# Patient Record
Sex: Male | Born: 1995 | Race: White | Hispanic: No | Marital: Married
Health system: Southern US, Community
[De-identification: ages and names within clinical notes are randomized; demographics above are authoritative.]

---
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
## 2013-06-04 ENCOUNTER — Emergency Department: Payer: Self-pay | Admitting: Emergency Medicine

## 2014-10-06 ENCOUNTER — Emergency Department: Payer: Self-pay | Admitting: Emergency Medicine

## 2015-03-28 NOTE — Consult Note (Signed)
PATIENT NAME:  Roger Walls, [name] MR#:  [id number] DATE OF BIRTH:  [birth year]  DATE OF CONSULTATION:  02/15/2012  REFERRING PHYSICIAN:  Daryel NovemberJonathan Williams, MD  CONSULTING PHYSICIAN:  Zackery BarefootJ. Madison Rochele Lueck, MD  HISTORY OF PRESENT ILLNESS: The patient is a 19 year old young man who was playing Lacrosse with no helmet, got hit in the ear with a Lacrosse ball. There is some question as to whether or not he has an auricular hematoma. He had no loss of consciousness. Had a CT scan of the head which was negative. Had a CT scan of the maxillofacial area which was negative.   ALLERGIES: None.   MEDICATIONS: None.   PAST MEDICAL HISTORY: Negative.   SOCIAL HISTORY: The patient plays high school Lacrosse.   PHYSICAL EXAMINATION: Right ear is bruised and ecchymotic. There is no palpable subperichondrial fluid collection. No drainable hematoma. There is also palpable fracture of the concha cymba, concha cavum, tender to palpation.   IMPRESSION: Right ear ecchymosis secondary to trauma (Lacrosse ball).  RECOMMENDATIONS: Recommended ice pack to the ear for 48 hours while awake. Careful avoidance of traumatic manipulation of the ear to include wearing Lacrosse helmet. I will follow-up with him on Monday in my clinic. If there has been no interval increase in fluid collection in the subperichondrial pinna, then there will be no need for operative intervention and he can resume Lacrosse as long as he wears his helmet.  I discussed all of this with his parents and with the patient himself. If he develops more of a fluid collection, he will call and we will drain any fluid collection and apply the appropriate compressive dressing.     I did advise him that he will likely develop long-term mild increase in ossification of his cartilage in the traumatized area but there should be no visible morphologic changes of the ear.   ____________________________ J. Gertie BaronMadison Darivs Lunden, MD jmc:drc D: 02/16/2012 05:33:49  ET T: 02/16/2012 09:53:47 ET JOB#: [id number]  cc: Zackery BarefootJ. Madison Zaiah Credeur, MD, <Dictator> Glorious [facility]achSonya [facility] Wendee CoppJMADISON Dalayna Lauter MD ELECTRONICALLY SIGNED 03/01/2012 11:33

## 2020-07-26 ENCOUNTER — Ambulatory Visit: Payer: Self-pay | Attending: Internal Medicine

## 2020-07-26 DIAGNOSIS — Z23 Encounter for immunization: Secondary | ICD-10-CM

## 2020-07-26 NOTE — Progress Notes (Signed)
   Covid-19 Vaccination Clinic  Name:  Roger Walls    MRN: [id number] DOB: 1996/05/17  07/26/2020  [name] was observed post Covid-19 immunization for 15 minutes without incident. He was provided with Vaccine Information Sheet and instruction to access the V-Safe system.   [name] was instructed to call 911 with any severe reactions post vaccine: Marland Kitchen Difficulty breathing  . Swelling of face and throat  . A fast heartbeat  . A bad rash all over body  . Dizziness and weakness   Immunizations Administered    Name Date Dose VIS Date Route   Pfizer COVID-19 Vaccine 07/26/2020  2:11 PM 0.3 mL 01/28/2019 Intramuscular   Manufacturer: [employer], [employer]   Lot: [id number]   NDC: [address]-2

## 2020-08-16 ENCOUNTER — Ambulatory Visit: Payer: Self-pay

## 2020-08-23 ENCOUNTER — Ambulatory Visit: Payer: Self-pay | Attending: Internal Medicine

## 2020-08-23 DIAGNOSIS — Z23 Encounter for immunization: Secondary | ICD-10-CM

## 2020-08-23 NOTE — Progress Notes (Signed)
° °  Covid-19 Vaccination Clinic  Name:  Roger Walls    MRN: [id number] DOB: 31-Aug-1996  08/23/2020  Mr. Roger Walls was observed post Covid-19 immunization for 15 minutes without incident. He was provided with Vaccine Information Sheet and instruction to access the V-Safe system.   Mr. Roger Walls was instructed to call 911 with any severe reactions post vaccine:  Difficulty breathing   Swelling of face and throat   A fast heartbeat   A bad rash all over body   Dizziness and weakness   Immunizations Administered    Name Date Dose VIS Date Route   Pfizer COVID-19 Vaccine 08/23/2020  3:46 PM 0.3 mL 01/28/2019 Intramuscular   Manufacturer: [employer], [employer]   Lot: [id number]   NDC: [address]-0

## 2020-10-19 ENCOUNTER — Encounter: Payer: Self-pay | Admitting: Obstetrics and Gynecology

## 2021-03-09 ENCOUNTER — Other Ambulatory Visit: Payer: Self-pay

## 2021-03-09 ENCOUNTER — Ambulatory Visit (INDEPENDENT_AMBULATORY_CARE_PROVIDER_SITE_OTHER): Payer: BC Managed Care – PPO | Admitting: Podiatry

## 2021-03-09 DIAGNOSIS — L603 Nail dystrophy: Secondary | ICD-10-CM

## 2021-03-09 DIAGNOSIS — B351 Tinea unguium: Secondary | ICD-10-CM

## 2021-03-09 DIAGNOSIS — L600 Ingrowing nail: Secondary | ICD-10-CM

## 2021-03-09 MED ORDER — EFINACONAZOLE 10 % EX SOLN: 1.0000 [drp] | Freq: Every day | CUTANEOUS | 11 refills | Status: AC

## 2021-03-09 MED ORDER — TERBINAFINE HCL 250 MG PO TABS: 250.0000 mg | ORAL_TABLET | Freq: Every day | ORAL | 0 refills | Status: AC

## 2021-03-09 NOTE — Progress Notes (Signed)
  Subjective:  Patient ID: Roger Walls, male    DOB: Jan 18, 1996,  MRN: [id number]  Chief Complaint  Patient presents with  . Nail Problem    Patient presents today for nail fungus most toes bilat feet and recurrent ingrown toenails bilat hallux.  He says his nails aren't hurting now but they become ingrown often   He is here to discuss treatment    25 y.o. male presents with the above complaint. History confirmed with patient.   Objective:  Physical Exam: warm, good capillary refill, no trophic changes or ulcerative lesions, normal DP and PT pulses and normal sensory exam.  Mild ingrowing left medial and right lateral hallux nail borders, he has onychomycosis with thickening of the nail plates and discoloration with subungual debris Assessment:   1. Onychomycosis   2. Ingrowing left great toenail   3. Ingrowing right great toenail      Plan:  Patient was evaluated and treated and all questions answered.  Discussed etiology treatment options for onychomycosis with him including oral topical and laser treatment.  We discussed things the nails really contributing to his ingrown toenails.  They are currently painful and causing infection.  And I took a culture of the nail plate today to send to [facility] pathology for analysis.  I recommended oral and topical treatment with terbinafine and Jublia, and prescriptions were sent to [facility] for him.  Return in about 4 months (around 07/09/2021) for check nail fungus.

## 2021-03-09 NOTE — Patient Instructions (Signed)
Fungal Nail Infection A fungal nail infection is a common infection of the toenails or fingernails. This condition affects toenails more often than fingernails. It often affects the great, or big, toes. More than one nail may be infected. The condition can be passed from person to person (is contagious). What are the causes? This condition is caused by a fungus. Several types of fungi can cause the infection. These fungi are common in moist and warm areas. If your hands or feet come into contact with the fungus, it may get into a crack in your fingernail or toenail and cause the infection. What increases the risk? The following factors may make you more likely to develop this condition:  Being male.  Being of older age.  Living with someone who has the fungus.  Walking barefoot in areas where the fungus thrives, such as showers or locker rooms.  Wearing shoes and socks that cause your feet to sweat.  Having a nail injury or a recent nail surgery.  Having certain medical conditions, such as: ? Athlete's foot. ? Diabetes. ? Psoriasis. ? Poor circulation. ? A weak body defense system (immune system). What are the signs or symptoms? Symptoms of this condition include:  A pale spot on the nail.  Thickening of the nail.  A nail that becomes yellow or brown.  A brittle or ragged nail edge.  A crumbling nail.  A nail that has lifted away from the nail bed.   How is this diagnosed? This condition is diagnosed with a physical exam. Your health care provider may take a scraping or clipping from your nail to test for the fungus. How is this treated? Treatment is not needed for mild infections. If you have significant nail changes, treatment may include:  Antifungal medicines taken by mouth (orally). You may need to take the medicine for several weeks or several months, and you may not see the results for a long time. These medicines can cause side effects. Ask your health care  provider what problems to watch for.  Antifungal nail polish or nail cream. These may be used along with oral antifungal medicines.  Laser treatment of the nail.  Surgery to remove the nail. This may be needed for the most severe infections. It can take a long time, usually up to a year, for the infection to go away. The infection may also come back.   Follow these instructions at home: Medicines  Take or apply over-the-counter and prescription medicines only as told by your health care provider.  Ask your health care provider about using over-the-counter mentholated ointment on your nails. Nail care  Trim your nails often.  Wash and dry your hands and feet every day.  Keep your feet dry: ? Wear absorbent socks, and change your socks frequently. ? Wear shoes that allow air to circulate, such as sandals or canvas tennis shoes. Throw out old shoes.  Do not use artificial nails.  If you go to a nail salon, make sure you choose one that uses clean instruments.  Use antifungal foot powder on your feet and in your shoes. General instructions  Do not share personal items, such as towels or nail clippers.  Do not walk barefoot in shower rooms or locker rooms.  Wear rubber gloves if you are working with your hands in wet areas.  Keep all follow-up visits as told by your health care provider. This is important. Contact a health care provider if: Your infection is not getting better or   it is getting worse after several months. Summary  A fungal nail infection is a common infection of the toenails or fingernails.  Treatment is not needed for mild infections. If you have significant nail changes, treatment may include taking medicine orally and applying medicine to your nails.  It can take a long time, usually up to a year, for the infection to go away. The infection may also come back.  Take or apply over-the-counter and prescription medicines only as told by your health care  provider.  Follow instructions for taking care of your nails to help prevent infection from coming back or spreading. This information is not intended to replace advice given to you by your health care provider. Make sure you discuss any questions you have with your health care provider. Document Revised: 03/13/2019 Document Reviewed: 04/26/2018 Elsevier Patient Education  2021 Elsevier Inc.  

## 2021-03-28 ENCOUNTER — Encounter: Payer: Self-pay | Admitting: *Deleted

## 2021-07-11 ENCOUNTER — Ambulatory Visit (INDEPENDENT_AMBULATORY_CARE_PROVIDER_SITE_OTHER): Payer: BC Managed Care – PPO | Admitting: Podiatry

## 2021-07-11 ENCOUNTER — Other Ambulatory Visit: Payer: Self-pay

## 2021-07-11 ENCOUNTER — Encounter: Payer: Self-pay | Admitting: Podiatry

## 2021-07-11 DIAGNOSIS — B351 Tinea unguium: Secondary | ICD-10-CM

## 2021-07-11 MED ORDER — TERBINAFINE HCL 250 MG PO TABS: 250.0000 mg | ORAL_TABLET | Freq: Every day | ORAL | 0 refills | Status: DC

## 2021-07-11 NOTE — Progress Notes (Signed)
  Subjective:  Patient ID: Roger Walls, male    DOB: Oct 31, 1996,  MRN: [id number]  Chief Complaint  Patient presents with   Nail Problem     FUNGAL NAILS    25 y.o. male presents with the above complaint. History confirmed with patient.  He has had quite a bit of improvement  Objective:  Physical Exam: warm, good capillary refill, no trophic changes or ulcerative lesions, normal DP and PT pulses and normal sensory exam.  Mild ingrowing left medial and right lateral hallux nail borders, approximately 50% clearing of onychomycosis Assessment:   1. Onychomycosis       Plan:  Patient was evaluated and treated and all questions answered.  Doing very well I recommended 1 more course of Lamisil and continuing the Jublia until this clears out.  He will let me know if he has any issues with this or returns or does not improve within 4 to 6 months  Return if symptoms worsen or fail to improve.

## 2021-08-03 ENCOUNTER — Telehealth: Payer: Self-pay | Admitting: Podiatry

## 2021-08-03 NOTE — Telephone Encounter (Signed)
Fax a refill on terbinafine 250mg  tablets was sent on 08/03/2021 at 3;14 pm

## 2021-09-29 ENCOUNTER — Other Ambulatory Visit: Payer: Self-pay | Admitting: Podiatry

## 2021-09-29 MED ORDER — TERBINAFINE HCL 250 MG PO TABS: 250.0000 mg | ORAL_TABLET | Freq: Every day | ORAL | 0 refills | Status: AC

## 2024-04-29 ENCOUNTER — Ambulatory Visit: Payer: Self-pay | Admitting: *Deleted

## 2024-04-29 NOTE — Telephone Encounter (Signed)
 Copied from CRM [id number]. Topic: Clinical - Red Word Triage >> Apr 29, 2024 11:54 AM Chuck Crater wrote: Red Word that prompted transfer to Nurse Triage: Patient is having a sinus issues. He has recurring headaches, increased  green/ yellow thick mucous, pressure in head, and sensitivity to light sometimes. Reason for Disposition  [1] Sinus congestion (pressure, fullness) AND [2] present > 10 days  Answer Assessment - Initial Assessment Questions 1. LOCATION: "Where does it hurt?"      Non established pt calling in to be established with [facility]. I'm having headaches one or two a week for the past year or so.   Now I'm having sinus congestion.   I've had a headache for a week now.  I would like to be seen.   2. ONSET: "When did the sinus pain start?"  (e.g., hours, days)      2 weeks as far as getting bad. 3. SEVERITY: "How bad is the pain?"   (Scale 1-10; mild, moderate or severe)   - MILD (1-3): doesn't interfere with normal activities    - MODERATE (4-7): interferes with normal activities (e.g., work or school) or awakens from sleep   - SEVERE (8-10): excruciating pain and patient unable to do any normal activities        Thick green/yellow mucus   Blowing it all the time. 4. RECURRENT SYMPTOM: "Have you ever had sinus problems before?" If Yes, ask: "When was the last time?" and "What happened that time?"      Yes for a year now 5. NASAL CONGESTION: "Is the nose blocked?" If Yes, ask: "Can you open it or must you breathe through your mouth?"     Yes 6. NASAL DISCHARGE: "Do you have discharge from your nose?" If so ask, "What color?"     Yes 7. FEVER: "Do you have a fever?" If Yes, ask: "What is it, how was it measured, and when did it start?"      I don't know but don't feel like it. 8. OTHER SYMPTOMS: "Do you have any other symptoms?" (e.g., sore throat, cough, earache, difficulty breathing)     I have a slight cough, sore throat and a dry mouth.    Headaches.    9.  PREGNANCY: "Is there any chance you are pregnant?" "When was your last menstrual period?"     N/A  Protocols used: Sinus Pain or Congestion-A-AH  Chief Complaint: Sinus congestion and headaches for a year but the last 2 weeks the congestion and headaches have been worse. Symptoms: Mild cough and sore throat, headache for a week now.  Yellow/green mucus with blood tinged mucus "from blowing my nose so much".   Frequency: For the last year I've struggled with sinus congestion and headaches but worse over the last 2 weeks. Pertinent Negatives: Patient denies fever.  Disposition: [] ED /[] Urgent Care (no appt availability in office) / [x] Appointment(In office/virtual)/ []  [facility]/ [] Home Care/ [] Refused Recommended Disposition /[] [facility]/ []  Follow-up with PCP Additional Notes: Pt is not established with [facility] which is where he called into.   First new pt appt is July 01, 2024.   He is wanting to get in sooner than than for the sinus issue.   I let him know that going to the urgent care would probably be the best thing for his immediate treatment of the worsening sinus issues.  He is going to check around and see if he can get established  somewhere else sooner and if not he will call us  back.   I live near your office so prefer to be there but I would like to be established sooner than that.   I encouraged him to call back we would be glad to have him as a pt if he changed his mind.   He thanked me for helping him.   Does not have a PCP he is seeing now.   "I've never had a PCP".
